# Patient Record
Sex: Male | Born: 1955
Health system: Southern US, Community
[De-identification: ages and names within clinical notes are randomized; demographics above are authoritative.]

## PROBLEM LIST (undated history)

## (undated) DIAGNOSIS — Z87898 Personal history of other specified conditions: Secondary | ICD-10-CM

## (undated) DIAGNOSIS — Z86010 Personal history of colonic polyps: Secondary | ICD-10-CM

## (undated) DIAGNOSIS — E785 Hyperlipidemia, unspecified: Secondary | ICD-10-CM

## (undated) DIAGNOSIS — R9431 Abnormal electrocardiogram [ECG] [EKG]: Secondary | ICD-10-CM

## (undated) DIAGNOSIS — C439 Malignant melanoma of skin, unspecified: Secondary | ICD-10-CM

## (undated) DIAGNOSIS — K219 Gastro-esophageal reflux disease without esophagitis: Secondary | ICD-10-CM

## (undated) DIAGNOSIS — N529 Male erectile dysfunction, unspecified: Secondary | ICD-10-CM

## (undated) DIAGNOSIS — I714 Abdominal aortic aneurysm, without rupture, unspecified: Secondary | ICD-10-CM

## (undated) DIAGNOSIS — Z860101 Personal history of adenomatous and serrated colon polyps: Secondary | ICD-10-CM

## (undated) DIAGNOSIS — E119 Type 2 diabetes mellitus without complications: Secondary | ICD-10-CM

## (undated) HISTORY — DX: Malignant melanoma of skin, unspecified: C43.9

## (undated) HISTORY — DX: Personal history of adenomatous and serrated colon polyps: Z86.0101

## (undated) HISTORY — DX: Abdominal aortic aneurysm, without rupture, unspecified: I71.40

## (undated) HISTORY — PX: OTHER SURGICAL HISTORY: SHX169

## (undated) HISTORY — DX: Personal history of other specified conditions: Z87.898

## (undated) HISTORY — DX: Male erectile dysfunction, unspecified: N52.9

## (undated) HISTORY — DX: Abnormal electrocardiogram (ECG) (EKG): R94.31

## (undated) HISTORY — DX: Hyperlipidemia, unspecified: E78.5

## (undated) HISTORY — DX: Gastro-esophageal reflux disease without esophagitis: K21.9

## (undated) HISTORY — DX: Personal history of colonic polyps: Z86.010

## (undated) HISTORY — PX: SHOULDER SURGERY: SHX246

---

## 2000-03-29 ENCOUNTER — Encounter: Payer: Self-pay | Admitting: Family Medicine

## 2000-03-29 ENCOUNTER — Ambulatory Visit (HOSPITAL_COMMUNITY): Admission: RE | Admit: 2000-03-29 | Discharge: 2000-03-29 | Payer: Self-pay | Admitting: Family Medicine

## 2015-06-25 ENCOUNTER — Other Ambulatory Visit: Payer: Self-pay | Admitting: Internal Medicine

## 2015-06-25 ENCOUNTER — Ambulatory Visit
Admission: RE | Admit: 2015-06-25 | Discharge: 2015-06-25 | Disposition: A | Discharge status: Home / Self Care | Payer: 59 | Source: Ambulatory Visit | Attending: Internal Medicine | Admitting: Internal Medicine

## 2015-06-25 DIAGNOSIS — R2241 Localized swelling, mass and lump, right lower limb: Secondary | ICD-10-CM

## 2015-06-25 DIAGNOSIS — M898X9 Other specified disorders of bone, unspecified site: Secondary | ICD-10-CM

## 2016-09-29 DIAGNOSIS — E1165 Type 2 diabetes mellitus with hyperglycemia: Secondary | ICD-10-CM | POA: Diagnosis not present

## 2016-09-29 DIAGNOSIS — Z23 Encounter for immunization: Secondary | ICD-10-CM | POA: Diagnosis not present

## 2016-09-29 DIAGNOSIS — Z0000 Encounter for general adult medical examination without abnormal findings: Secondary | ICD-10-CM | POA: Diagnosis not present

## 2016-09-29 DIAGNOSIS — M25512 Pain in left shoulder: Secondary | ICD-10-CM | POA: Diagnosis not present

## 2016-09-29 DIAGNOSIS — K219 Gastro-esophageal reflux disease without esophagitis: Secondary | ICD-10-CM | POA: Diagnosis not present

## 2016-09-29 DIAGNOSIS — E782 Mixed hyperlipidemia: Secondary | ICD-10-CM | POA: Diagnosis not present

## 2016-10-08 ENCOUNTER — Other Ambulatory Visit: Payer: Self-pay | Admitting: Family Medicine

## 2016-10-08 DIAGNOSIS — M25512 Pain in left shoulder: Secondary | ICD-10-CM

## 2016-10-08 DIAGNOSIS — Z1283 Encounter for screening for malignant neoplasm of skin: Secondary | ICD-10-CM | POA: Diagnosis not present

## 2016-10-08 DIAGNOSIS — D225 Melanocytic nevi of trunk: Secondary | ICD-10-CM | POA: Diagnosis not present

## 2016-10-08 DIAGNOSIS — D485 Neoplasm of uncertain behavior of skin: Secondary | ICD-10-CM | POA: Diagnosis not present

## 2016-10-14 DIAGNOSIS — M67912 Unspecified disorder of synovium and tendon, left shoulder: Secondary | ICD-10-CM | POA: Diagnosis not present

## 2016-10-15 DIAGNOSIS — M25512 Pain in left shoulder: Secondary | ICD-10-CM | POA: Diagnosis not present

## 2016-10-15 DIAGNOSIS — S46092D Other injury of muscle(s) and tendon(s) of the rotator cuff of left shoulder, subsequent encounter: Secondary | ICD-10-CM | POA: Diagnosis not present

## 2016-10-21 DIAGNOSIS — M25512 Pain in left shoulder: Secondary | ICD-10-CM | POA: Diagnosis not present

## 2016-10-21 DIAGNOSIS — S46092D Other injury of muscle(s) and tendon(s) of the rotator cuff of left shoulder, subsequent encounter: Secondary | ICD-10-CM | POA: Diagnosis not present

## 2016-11-03 DIAGNOSIS — M25512 Pain in left shoulder: Secondary | ICD-10-CM | POA: Diagnosis not present

## 2016-11-03 DIAGNOSIS — S46092D Other injury of muscle(s) and tendon(s) of the rotator cuff of left shoulder, subsequent encounter: Secondary | ICD-10-CM | POA: Diagnosis not present

## 2016-11-05 DIAGNOSIS — S46092D Other injury of muscle(s) and tendon(s) of the rotator cuff of left shoulder, subsequent encounter: Secondary | ICD-10-CM | POA: Diagnosis not present

## 2016-11-05 DIAGNOSIS — M25512 Pain in left shoulder: Secondary | ICD-10-CM | POA: Diagnosis not present

## 2016-11-11 DIAGNOSIS — M67912 Unspecified disorder of synovium and tendon, left shoulder: Secondary | ICD-10-CM | POA: Diagnosis not present

## 2016-11-11 DIAGNOSIS — S46092D Other injury of muscle(s) and tendon(s) of the rotator cuff of left shoulder, subsequent encounter: Secondary | ICD-10-CM | POA: Diagnosis not present

## 2016-11-11 DIAGNOSIS — M25512 Pain in left shoulder: Secondary | ICD-10-CM | POA: Diagnosis not present

## 2016-11-17 DIAGNOSIS — M25512 Pain in left shoulder: Secondary | ICD-10-CM | POA: Diagnosis not present

## 2016-11-17 DIAGNOSIS — S46092D Other injury of muscle(s) and tendon(s) of the rotator cuff of left shoulder, subsequent encounter: Secondary | ICD-10-CM | POA: Diagnosis not present

## 2016-11-20 DIAGNOSIS — M25512 Pain in left shoulder: Secondary | ICD-10-CM | POA: Diagnosis not present

## 2016-11-20 DIAGNOSIS — S46092D Other injury of muscle(s) and tendon(s) of the rotator cuff of left shoulder, subsequent encounter: Secondary | ICD-10-CM | POA: Diagnosis not present

## 2016-11-26 DIAGNOSIS — M25512 Pain in left shoulder: Secondary | ICD-10-CM | POA: Diagnosis not present

## 2016-11-26 DIAGNOSIS — S46092D Other injury of muscle(s) and tendon(s) of the rotator cuff of left shoulder, subsequent encounter: Secondary | ICD-10-CM | POA: Diagnosis not present

## 2016-12-01 DIAGNOSIS — S46092D Other injury of muscle(s) and tendon(s) of the rotator cuff of left shoulder, subsequent encounter: Secondary | ICD-10-CM | POA: Diagnosis not present

## 2016-12-01 DIAGNOSIS — M25512 Pain in left shoulder: Secondary | ICD-10-CM | POA: Diagnosis not present

## 2016-12-03 DIAGNOSIS — M25512 Pain in left shoulder: Secondary | ICD-10-CM | POA: Diagnosis not present

## 2016-12-03 DIAGNOSIS — S46092D Other injury of muscle(s) and tendon(s) of the rotator cuff of left shoulder, subsequent encounter: Secondary | ICD-10-CM | POA: Diagnosis not present

## 2016-12-08 DIAGNOSIS — M25512 Pain in left shoulder: Secondary | ICD-10-CM | POA: Diagnosis not present

## 2016-12-08 DIAGNOSIS — S46092D Other injury of muscle(s) and tendon(s) of the rotator cuff of left shoulder, subsequent encounter: Secondary | ICD-10-CM | POA: Diagnosis not present

## 2016-12-11 DIAGNOSIS — M25512 Pain in left shoulder: Secondary | ICD-10-CM | POA: Diagnosis not present

## 2016-12-11 DIAGNOSIS — S46092D Other injury of muscle(s) and tendon(s) of the rotator cuff of left shoulder, subsequent encounter: Secondary | ICD-10-CM | POA: Diagnosis not present

## 2016-12-16 DIAGNOSIS — M67912 Unspecified disorder of synovium and tendon, left shoulder: Secondary | ICD-10-CM | POA: Diagnosis not present

## 2017-01-21 DIAGNOSIS — E1165 Type 2 diabetes mellitus with hyperglycemia: Secondary | ICD-10-CM | POA: Diagnosis not present

## 2017-03-17 DIAGNOSIS — M7502 Adhesive capsulitis of left shoulder: Secondary | ICD-10-CM | POA: Diagnosis not present

## 2017-03-24 DIAGNOSIS — M25512 Pain in left shoulder: Secondary | ICD-10-CM | POA: Diagnosis not present

## 2017-03-26 DIAGNOSIS — M75112 Incomplete rotator cuff tear or rupture of left shoulder, not specified as traumatic: Secondary | ICD-10-CM | POA: Diagnosis not present

## 2017-04-13 DIAGNOSIS — K219 Gastro-esophageal reflux disease without esophagitis: Secondary | ICD-10-CM | POA: Diagnosis not present

## 2017-04-13 DIAGNOSIS — Z23 Encounter for immunization: Secondary | ICD-10-CM | POA: Diagnosis not present

## 2017-04-13 DIAGNOSIS — E782 Mixed hyperlipidemia: Secondary | ICD-10-CM | POA: Diagnosis not present

## 2017-05-20 DIAGNOSIS — E1165 Type 2 diabetes mellitus with hyperglycemia: Secondary | ICD-10-CM | POA: Diagnosis not present

## 2017-05-28 DIAGNOSIS — M75112 Incomplete rotator cuff tear or rupture of left shoulder, not specified as traumatic: Secondary | ICD-10-CM | POA: Diagnosis not present

## 2017-06-24 DIAGNOSIS — Z08 Encounter for follow-up examination after completed treatment for malignant neoplasm: Secondary | ICD-10-CM | POA: Diagnosis not present

## 2017-06-24 DIAGNOSIS — Z85820 Personal history of malignant melanoma of skin: Secondary | ICD-10-CM | POA: Diagnosis not present

## 2017-06-24 DIAGNOSIS — Z1283 Encounter for screening for malignant neoplasm of skin: Secondary | ICD-10-CM | POA: Diagnosis not present

## 2017-06-29 DIAGNOSIS — G8918 Other acute postprocedural pain: Secondary | ICD-10-CM | POA: Diagnosis not present

## 2017-06-29 DIAGNOSIS — M75112 Incomplete rotator cuff tear or rupture of left shoulder, not specified as traumatic: Secondary | ICD-10-CM | POA: Diagnosis not present

## 2017-06-29 DIAGNOSIS — M7542 Impingement syndrome of left shoulder: Secondary | ICD-10-CM | POA: Diagnosis not present

## 2017-06-29 DIAGNOSIS — M7502 Adhesive capsulitis of left shoulder: Secondary | ICD-10-CM | POA: Diagnosis not present

## 2017-06-30 DIAGNOSIS — M25512 Pain in left shoulder: Secondary | ICD-10-CM | POA: Diagnosis not present

## 2017-06-30 DIAGNOSIS — M25612 Stiffness of left shoulder, not elsewhere classified: Secondary | ICD-10-CM | POA: Diagnosis not present

## 2017-07-01 DIAGNOSIS — M25512 Pain in left shoulder: Secondary | ICD-10-CM | POA: Diagnosis not present

## 2017-07-01 DIAGNOSIS — M25612 Stiffness of left shoulder, not elsewhere classified: Secondary | ICD-10-CM | POA: Diagnosis not present

## 2017-07-07 DIAGNOSIS — M25512 Pain in left shoulder: Secondary | ICD-10-CM | POA: Diagnosis not present

## 2017-07-07 DIAGNOSIS — M25612 Stiffness of left shoulder, not elsewhere classified: Secondary | ICD-10-CM | POA: Diagnosis not present

## 2017-07-08 DIAGNOSIS — M25612 Stiffness of left shoulder, not elsewhere classified: Secondary | ICD-10-CM | POA: Diagnosis not present

## 2017-07-08 DIAGNOSIS — M25512 Pain in left shoulder: Secondary | ICD-10-CM | POA: Diagnosis not present

## 2017-07-09 DIAGNOSIS — M25612 Stiffness of left shoulder, not elsewhere classified: Secondary | ICD-10-CM | POA: Diagnosis not present

## 2017-07-09 DIAGNOSIS — M25512 Pain in left shoulder: Secondary | ICD-10-CM | POA: Diagnosis not present

## 2017-07-12 DIAGNOSIS — M25512 Pain in left shoulder: Secondary | ICD-10-CM | POA: Diagnosis not present

## 2017-07-12 DIAGNOSIS — M25612 Stiffness of left shoulder, not elsewhere classified: Secondary | ICD-10-CM | POA: Diagnosis not present

## 2017-07-14 DIAGNOSIS — M25612 Stiffness of left shoulder, not elsewhere classified: Secondary | ICD-10-CM | POA: Diagnosis not present

## 2017-07-14 DIAGNOSIS — M25512 Pain in left shoulder: Secondary | ICD-10-CM | POA: Diagnosis not present

## 2017-07-16 DIAGNOSIS — M25612 Stiffness of left shoulder, not elsewhere classified: Secondary | ICD-10-CM | POA: Diagnosis not present

## 2017-07-16 DIAGNOSIS — M25512 Pain in left shoulder: Secondary | ICD-10-CM | POA: Diagnosis not present

## 2017-07-19 DIAGNOSIS — M25612 Stiffness of left shoulder, not elsewhere classified: Secondary | ICD-10-CM | POA: Diagnosis not present

## 2017-07-19 DIAGNOSIS — M25512 Pain in left shoulder: Secondary | ICD-10-CM | POA: Diagnosis not present

## 2017-07-21 DIAGNOSIS — M25612 Stiffness of left shoulder, not elsewhere classified: Secondary | ICD-10-CM | POA: Diagnosis not present

## 2017-07-21 DIAGNOSIS — M25512 Pain in left shoulder: Secondary | ICD-10-CM | POA: Diagnosis not present

## 2017-07-22 DIAGNOSIS — M25512 Pain in left shoulder: Secondary | ICD-10-CM | POA: Diagnosis not present

## 2017-07-22 DIAGNOSIS — M25612 Stiffness of left shoulder, not elsewhere classified: Secondary | ICD-10-CM | POA: Diagnosis not present

## 2017-07-26 DIAGNOSIS — M25612 Stiffness of left shoulder, not elsewhere classified: Secondary | ICD-10-CM | POA: Diagnosis not present

## 2017-07-26 DIAGNOSIS — M25512 Pain in left shoulder: Secondary | ICD-10-CM | POA: Diagnosis not present

## 2017-07-28 DIAGNOSIS — M25512 Pain in left shoulder: Secondary | ICD-10-CM | POA: Diagnosis not present

## 2017-07-28 DIAGNOSIS — M25612 Stiffness of left shoulder, not elsewhere classified: Secondary | ICD-10-CM | POA: Diagnosis not present

## 2017-08-02 DIAGNOSIS — M25612 Stiffness of left shoulder, not elsewhere classified: Secondary | ICD-10-CM | POA: Diagnosis not present

## 2017-08-02 DIAGNOSIS — M25512 Pain in left shoulder: Secondary | ICD-10-CM | POA: Diagnosis not present

## 2017-08-04 DIAGNOSIS — M25612 Stiffness of left shoulder, not elsewhere classified: Secondary | ICD-10-CM | POA: Diagnosis not present

## 2017-08-04 DIAGNOSIS — M25512 Pain in left shoulder: Secondary | ICD-10-CM | POA: Diagnosis not present

## 2017-08-06 DIAGNOSIS — M25512 Pain in left shoulder: Secondary | ICD-10-CM | POA: Diagnosis not present

## 2017-08-06 DIAGNOSIS — M25612 Stiffness of left shoulder, not elsewhere classified: Secondary | ICD-10-CM | POA: Diagnosis not present

## 2017-08-16 DIAGNOSIS — M25612 Stiffness of left shoulder, not elsewhere classified: Secondary | ICD-10-CM | POA: Diagnosis not present

## 2017-08-16 DIAGNOSIS — M25512 Pain in left shoulder: Secondary | ICD-10-CM | POA: Diagnosis not present

## 2017-08-18 DIAGNOSIS — M25512 Pain in left shoulder: Secondary | ICD-10-CM | POA: Diagnosis not present

## 2017-08-18 DIAGNOSIS — M25612 Stiffness of left shoulder, not elsewhere classified: Secondary | ICD-10-CM | POA: Diagnosis not present

## 2017-08-19 DIAGNOSIS — E1165 Type 2 diabetes mellitus with hyperglycemia: Secondary | ICD-10-CM | POA: Diagnosis not present

## 2017-08-30 DIAGNOSIS — M25512 Pain in left shoulder: Secondary | ICD-10-CM | POA: Diagnosis not present

## 2017-08-30 DIAGNOSIS — M25612 Stiffness of left shoulder, not elsewhere classified: Secondary | ICD-10-CM | POA: Diagnosis not present

## 2017-11-26 DIAGNOSIS — K219 Gastro-esophageal reflux disease without esophagitis: Secondary | ICD-10-CM | POA: Diagnosis not present

## 2017-11-26 DIAGNOSIS — Z23 Encounter for immunization: Secondary | ICD-10-CM | POA: Diagnosis not present

## 2017-11-26 DIAGNOSIS — Z125 Encounter for screening for malignant neoplasm of prostate: Secondary | ICD-10-CM | POA: Diagnosis not present

## 2017-11-26 DIAGNOSIS — E782 Mixed hyperlipidemia: Secondary | ICD-10-CM | POA: Diagnosis not present

## 2017-11-26 DIAGNOSIS — Z0000 Encounter for general adult medical examination without abnormal findings: Secondary | ICD-10-CM | POA: Diagnosis not present

## 2017-12-28 DIAGNOSIS — R972 Elevated prostate specific antigen [PSA]: Secondary | ICD-10-CM | POA: Diagnosis not present

## 2018-01-05 DIAGNOSIS — E119 Type 2 diabetes mellitus without complications: Secondary | ICD-10-CM | POA: Diagnosis not present

## 2018-02-01 DIAGNOSIS — N400 Enlarged prostate without lower urinary tract symptoms: Secondary | ICD-10-CM | POA: Diagnosis not present

## 2018-02-01 DIAGNOSIS — R972 Elevated prostate specific antigen [PSA]: Secondary | ICD-10-CM | POA: Diagnosis not present

## 2018-02-17 DIAGNOSIS — R972 Elevated prostate specific antigen [PSA]: Secondary | ICD-10-CM | POA: Diagnosis not present

## 2018-02-22 DIAGNOSIS — Z23 Encounter for immunization: Secondary | ICD-10-CM | POA: Diagnosis not present

## 2018-03-01 DIAGNOSIS — R972 Elevated prostate specific antigen [PSA]: Secondary | ICD-10-CM | POA: Diagnosis not present

## 2018-03-03 DIAGNOSIS — E1165 Type 2 diabetes mellitus with hyperglycemia: Secondary | ICD-10-CM | POA: Diagnosis not present

## 2018-03-03 DIAGNOSIS — N529 Male erectile dysfunction, unspecified: Secondary | ICD-10-CM | POA: Diagnosis not present

## 2018-03-03 DIAGNOSIS — Z5181 Encounter for therapeutic drug level monitoring: Secondary | ICD-10-CM | POA: Diagnosis not present

## 2018-05-30 DIAGNOSIS — B078 Other viral warts: Secondary | ICD-10-CM | POA: Diagnosis not present

## 2018-05-30 DIAGNOSIS — B079 Viral wart, unspecified: Secondary | ICD-10-CM | POA: Diagnosis not present

## 2018-05-30 DIAGNOSIS — D229 Melanocytic nevi, unspecified: Secondary | ICD-10-CM | POA: Diagnosis not present

## 2018-05-30 DIAGNOSIS — Z1283 Encounter for screening for malignant neoplasm of skin: Secondary | ICD-10-CM | POA: Diagnosis not present

## 2018-05-30 DIAGNOSIS — D225 Melanocytic nevi of trunk: Secondary | ICD-10-CM | POA: Diagnosis not present

## 2018-05-30 DIAGNOSIS — D485 Neoplasm of uncertain behavior of skin: Secondary | ICD-10-CM | POA: Diagnosis not present

## 2018-05-30 DIAGNOSIS — Z85820 Personal history of malignant melanoma of skin: Secondary | ICD-10-CM | POA: Diagnosis not present

## 2018-06-07 DIAGNOSIS — K219 Gastro-esophageal reflux disease without esophagitis: Secondary | ICD-10-CM | POA: Diagnosis not present

## 2018-06-07 DIAGNOSIS — E782 Mixed hyperlipidemia: Secondary | ICD-10-CM | POA: Diagnosis not present

## 2018-06-07 DIAGNOSIS — N529 Male erectile dysfunction, unspecified: Secondary | ICD-10-CM | POA: Diagnosis not present

## 2018-06-14 DIAGNOSIS — N529 Male erectile dysfunction, unspecified: Secondary | ICD-10-CM | POA: Diagnosis not present

## 2018-06-14 DIAGNOSIS — E1165 Type 2 diabetes mellitus with hyperglycemia: Secondary | ICD-10-CM | POA: Diagnosis not present

## 2018-06-14 DIAGNOSIS — Z79899 Other long term (current) drug therapy: Secondary | ICD-10-CM | POA: Diagnosis not present

## 2018-08-29 DIAGNOSIS — R972 Elevated prostate specific antigen [PSA]: Secondary | ICD-10-CM | POA: Diagnosis not present

## 2018-09-15 DIAGNOSIS — Z5181 Encounter for therapeutic drug level monitoring: Secondary | ICD-10-CM | POA: Diagnosis not present

## 2018-09-15 DIAGNOSIS — E1165 Type 2 diabetes mellitus with hyperglycemia: Secondary | ICD-10-CM | POA: Diagnosis not present

## 2018-09-15 DIAGNOSIS — N529 Male erectile dysfunction, unspecified: Secondary | ICD-10-CM | POA: Diagnosis not present

## 2019-09-08 ENCOUNTER — Other Ambulatory Visit: Payer: Self-pay | Admitting: Urology

## 2019-09-08 DIAGNOSIS — R972 Elevated prostate specific antigen [PSA]: Secondary | ICD-10-CM

## 2019-10-07 ENCOUNTER — Ambulatory Visit
Admission: RE | Admit: 2019-10-07 | Discharge: 2019-10-07 | Disposition: A | Discharge status: Home / Self Care | Payer: 59 | Source: Ambulatory Visit | Attending: Urology | Admitting: Urology

## 2019-10-07 DIAGNOSIS — R972 Elevated prostate specific antigen [PSA]: Secondary | ICD-10-CM

## 2019-10-07 IMAGING — MR MR PROSTATE WO/W CM
56 series · 56 of 56 positions shown · IV contrast (14ml multihance)
Comparison: None.

CLINICAL DATA: Elevated PSA (9.7).  Benign biopsy 2 years ago.

EXAM:
MR PROSTATE WITHOUT AND WITH CONTRAST
TECHNIQUE: Multiplanar multisequence MRI images were obtained of the pelvis
centered about the prostate. Pre and post contrast images were
obtained.
CONTRAST:  14mL MULTIHANCE GADOBENATE DIMEGLUMINE 529 MG/ML IV SOLN

[Series 2: bSSFP fat-sat · axial · 8.0mm · 0.74mm/px · 1 of 28 slices shown]
[im 1/28]
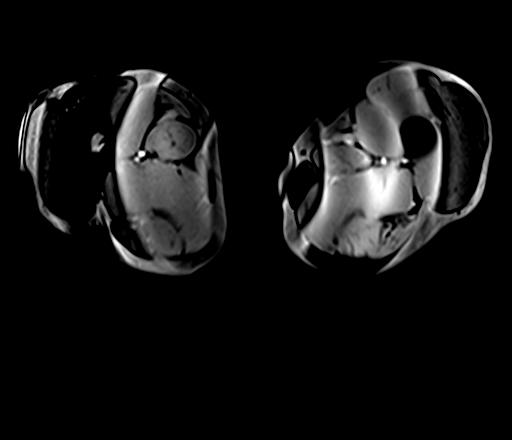

[Series 3: T1 · axial · 5.0mm · 1.25mm/px · 1 of 80 slices shown]
[im 1/80]
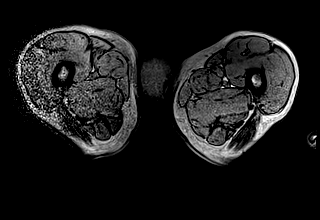

[Series 4: T2 · coronal · 3.5mm · 0.56mm/px · 1 of 27 slices shown (1 of 3)]
[im 1/27]
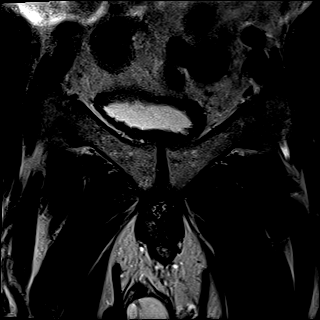

[Series 5: DWI · axial · 3.5mm · 1.75mm/px · 1 of 83 slices shown (1 of 3)]
[im 1/83]
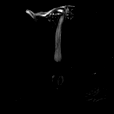

[Series 6: DWI · axial · 3.5mm · 1.75mm/px · 1 of 28 slices shown (2 of 3)]
[im 1/28]
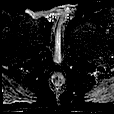

[Series 7: DWI · axial · 3.5mm · 1.56mm/px · 1 of 23 slices shown (3 of 3)]
[im 1/23]
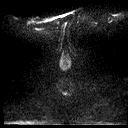

[Series 8: T2 · axial · 3.5mm · 0.56mm/px · 1 of 23 slices shown (2 of 3)]
[im 1/23]
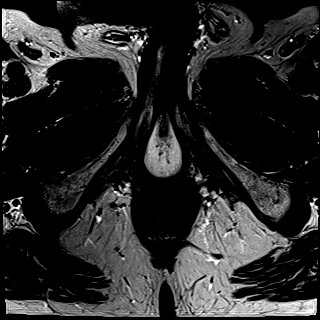

[Series 9: T2 · axial · 1.0mm · 1.04mm/px · 1 of 80 slices shown (3 of 3)]
[im 1/80]
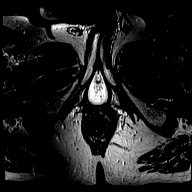

[Series 10: pre t1_twist_tra_dyn_ttc=5.8s · axial · non-contrast · 3.5mm · 0.83mm/px · 1 of 24 slices shown]
[im 1/24]
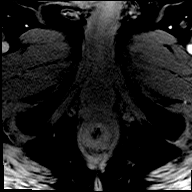

[Series 11: post t1_twist_tra_dyn-copy center · axial · 3.5mm · 0.83mm/px · 1 of 24 slices shown (1 of 24)]
[im 1/24]
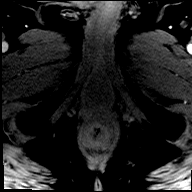

[Series 12: post t1_twist_tra_dyn-copy center · axial · 3.5mm · 0.83mm/px · 1 of 24 slices shown (2 of 24)]
[im 1/24]
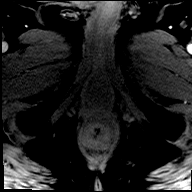

[Series 13: post t1_twist_tra_dyn-copy cent_sub_ttc=(id) · axial · 3.5mm · 0.83mm/px · 1 of 22 slices shown (1 of 23)]
[im 1/22]
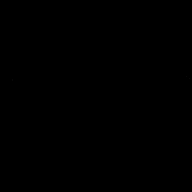

[Series 14: post t1_twist_tra_dyn-copy center · axial · 3.5mm · 0.83mm/px · 1 of 24 slices shown (3 of 24)]
[im 1/24]
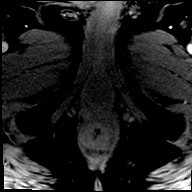

[Series 15: post t1_twist_tra_dyn-copy cent_sub_ttc=(id) · axial · 3.5mm · 0.83mm/px · 1 of 24 slices shown (2 of 23)]
[im 1/24]
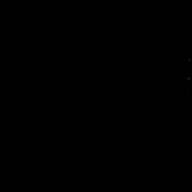

[Series 16: post t1_twist_tra_dyn-copy center · axial · 3.5mm · 0.83mm/px · 1 of 24 slices shown (4 of 24)]
[im 1/24]
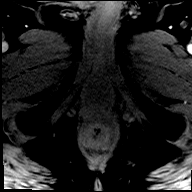

[Series 17: post t1_twist_tra_dyn-copy cent_sub_ttc=(id) · axial · 3.5mm · 0.83mm/px · 1 of 23 slices shown (3 of 23)]
[im 1/23]
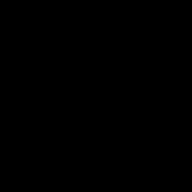

[Series 18: post t1_twist_tra_dyn-copy center · axial · 3.5mm · 0.83mm/px · 1 of 24 slices shown (5 of 24)]
[im 1/24]
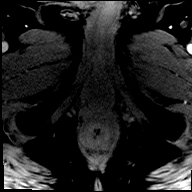

[Series 19: post t1_twist_tra_dyn-copy cent_sub_ttc=(id) · axial · 3.5mm · 0.83mm/px · 1 of 24 slices shown (4 of 23)]
[im 1/24]
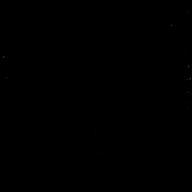

[Series 20: post t1_twist_tra_dyn-copy center · axial · 3.5mm · 0.83mm/px · 1 of 24 slices shown (6 of 24)]
[im 1/24]
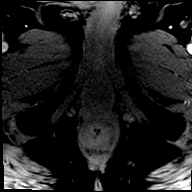

[Series 21: post t1_twist_tra_dyn-copy cent_sub_ttc=(id) · axial · 3.5mm · 0.83mm/px · 1 of 24 slices shown (5 of 23)]
[im 1/24]
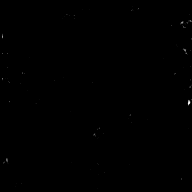

[Series 22: post t1_twist_tra_dyn-copy center · axial · 3.5mm · 0.83mm/px · 1 of 24 slices shown (7 of 24)]
[im 1/24]
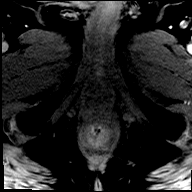

[Series 23: post t1_twist_tra_dyn-copy cent_sub_ttc=(id) · axial · 3.5mm · 0.83mm/px · 1 of 24 slices shown (6 of 23)]
[im 1/24]
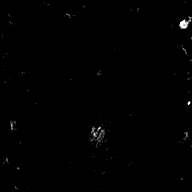

[Series 24: post t1_twist_tra_dyn-copy center · axial · 3.5mm · 0.83mm/px · 1 of 24 slices shown (8 of 24)]
[im 1/24]
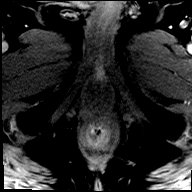

[Series 25: post t1_twist_tra_dyn-copy cent_sub_ttc=(id) · axial · 3.5mm · 0.83mm/px · 1 of 24 slices shown (7 of 23)]
[im 1/24]
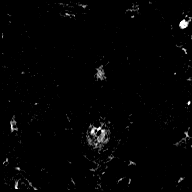

[Series 26: post t1_twist_tra_dyn-copy center · axial · 3.5mm · 0.83mm/px · 1 of 24 slices shown (9 of 24)]
[im 1/24]
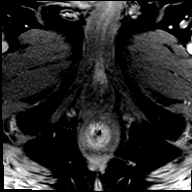

[Series 27: post t1_twist_tra_dyn-copy cent_sub_ttc=(id) · axial · 3.5mm · 0.83mm/px · 1 of 24 slices shown (8 of 23)]
[im 1/24]
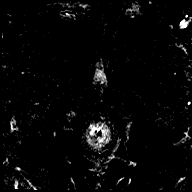

[Series 28: post t1_twist_tra_dyn-copy center · axial · 3.5mm · 0.83mm/px · 1 of 24 slices shown (10 of 24)]
[im 1/24]
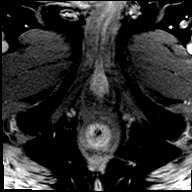

[Series 29: post t1_twist_tra_dyn-copy cent_sub_ttc=(id) · axial · 3.5mm · 0.83mm/px · 1 of 24 slices shown (9 of 23)]
[im 1/24]
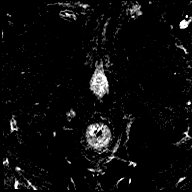

[Series 30: post t1_twist_tra_dyn-copy center · axial · 3.5mm · 0.83mm/px · 1 of 24 slices shown (11 of 24)]
[im 1/24]
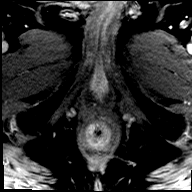

[Series 31: post t1_twist_tra_dyn-copy cent_sub_ttc=(id) · axial · 3.5mm · 0.83mm/px · 1 of 24 slices shown (10 of 23)]
[im 1/24]
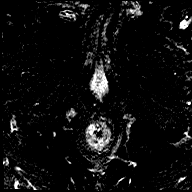

[Series 32: post t1_twist_tra_dyn-copy center · axial · 3.5mm · 0.83mm/px · 1 of 24 slices shown (12 of 24)]
[im 1/24]
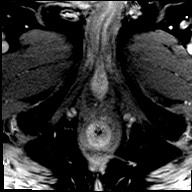

[Series 33: post t1_twist_tra_dyn-copy cent_sub_ttc=(id) · axial · 3.5mm · 0.83mm/px · 1 of 24 slices shown (11 of 23)]
[im 1/24]
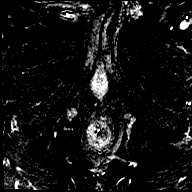

[Series 34: post t1_twist_tra_dyn-copy center · axial · 3.5mm · 0.83mm/px · 1 of 24 slices shown (13 of 24)]
[im 1/24]
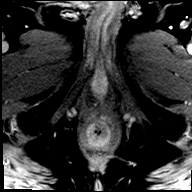

[Series 35: post t1_twist_tra_dyn-copy cent_sub_ttc=(id) · axial · 3.5mm · 0.83mm/px · 1 of 24 slices shown (12 of 23)]
[im 1/24]
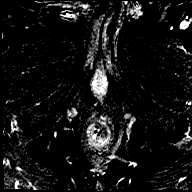

[Series 36: post t1_twist_tra_dyn-copy center · axial · 3.5mm · 0.83mm/px · 1 of 24 slices shown (14 of 24)]
[im 1/24]
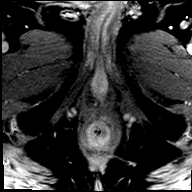

[Series 37: post t1_twist_tra_dyn-copy cent_sub_ttc=(id) · axial · 3.5mm · 0.83mm/px · 1 of 24 slices shown (13 of 23)]
[im 1/24]
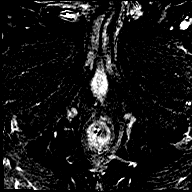

[Series 38: post t1_twist_tra_dyn-copy center · axial · 3.5mm · 0.83mm/px · 1 of 24 slices shown (15 of 24)]
[im 1/24]
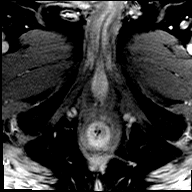

[Series 39: post t1_twist_tra_dyn-copy cent_sub_ttc=(id) · axial · 3.5mm · 0.83mm/px · 1 of 24 slices shown (14 of 23)]
[im 1/24]
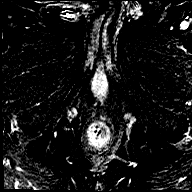

[Series 40: post t1_twist_tra_dyn-copy center · axial · 3.5mm · 0.83mm/px · 1 of 24 slices shown (16 of 24)]
[im 1/24]
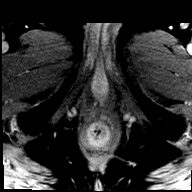

[Series 41: post t1_twist_tra_dyn-copy cent_sub_ttc=(id) · axial · 3.5mm · 0.83mm/px · 1 of 24 slices shown (15 of 23)]
[im 1/24]
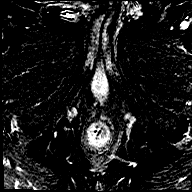

[Series 42: post t1_twist_tra_dyn-copy center · axial · 3.5mm · 0.83mm/px · 1 of 24 slices shown (17 of 24)]
[im 1/24]
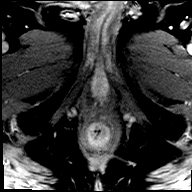

[Series 43: post t1_twist_tra_dyn-copy cent_sub_ttc=(id) · axial · 3.5mm · 0.83mm/px · 1 of 24 slices shown (16 of 23)]
[im 1/24]
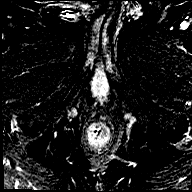

[Series 44: post t1_twist_tra_dyn-copy center · axial · 3.5mm · 0.83mm/px · 1 of 24 slices shown (18 of 24)]
[im 1/24]
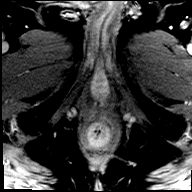

[Series 45: post t1_twist_tra_dyn-copy cent_sub_ttc=(id) · axial · 3.5mm · 0.83mm/px · 1 of 24 slices shown (17 of 23)]
[im 1/24]
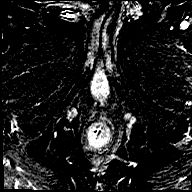

[Series 46: post t1_twist_tra_dyn-copy center · axial · 3.5mm · 0.83mm/px · 1 of 24 slices shown (19 of 24)]
[im 1/24]
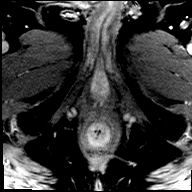

[Series 47: post t1_twist_tra_dyn-copy cent_sub_ttc=(id) · axial · 3.5mm · 0.83mm/px · 1 of 24 slices shown (18 of 23)]
[im 1/24]
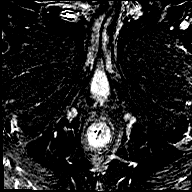

[Series 48: post t1_twist_tra_dyn-copy center · axial · 3.5mm · 0.83mm/px · 1 of 24 slices shown (20 of 24)]
[im 1/24]
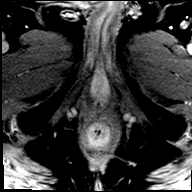

[Series 49: post t1_twist_tra_dyn-copy cent_sub_ttc=(id) · axial · 3.5mm · 0.83mm/px · 1 of 24 slices shown (19 of 23)]
[im 1/24]
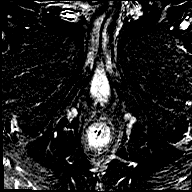

[Series 50: post t1_twist_tra_dyn-copy center · axial · 3.5mm · 0.83mm/px · 1 of 24 slices shown (21 of 24)]
[im 1/24]
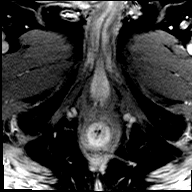

[Series 51: post t1_twist_tra_dyn-copy cent_sub_ttc=(id) · axial · 3.5mm · 0.83mm/px · 1 of 24 slices shown (20 of 23)]
[im 1/24]
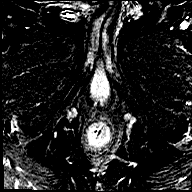

[Series 52: post t1_twist_tra_dyn-copy center · axial · 3.5mm · 0.83mm/px · 1 of 24 slices shown (22 of 24)]
[im 1/24]
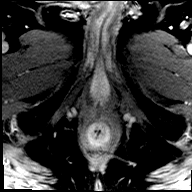

[Series 53: post t1_twist_tra_dyn-copy cent_sub_ttc=(id) · axial · 3.5mm · 0.83mm/px · 1 of 24 slices shown (21 of 23)]
[im 1/24]
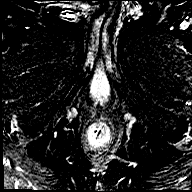

[Series 54: post t1_twist_tra_dyn-copy center · axial · 3.5mm · 0.83mm/px · 1 of 24 slices shown (23 of 24)]
[im 1/24]
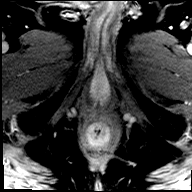

[Series 55: post t1_twist_tra_dyn-copy cent_sub_ttc=(id) · axial · 3.5mm · 0.83mm/px · 1 of 24 slices shown (22 of 23)]
[im 1/24]
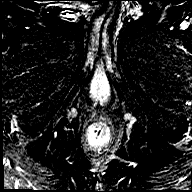

[Series 56: post t1_twist_tra_dyn-copy center · axial · 3.5mm · 0.83mm/px · 1 of 24 slices shown (24 of 24)]
[im 1/24]
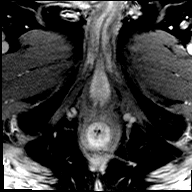

[Series 57: post t1_twist_tra_dyn-copy cent_sub_ttc=(id) · axial · 3.5mm · 0.83mm/px · 1 of 24 slices shown (23 of 23)]
[im 1/24]
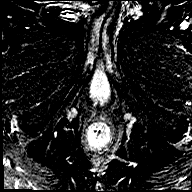

[56 of 56 positions shown; findings below may reference images not displayed]

FINDINGS: Prostate: 9 x 6 x 10 mm focal low T2 lesion at the extreme right
apex of the peripheral zone (series 9/image 63), mildly
linear/wedge-shaped, but with associated early arterial enhancement
(series 22/image 18). No restricted diffusion/low ADC. PI-RADS 3.

Marked enlargement/nodularity of the central gland, indenting the
base of the bladder, suggesting BPH. Some of the BPH nodules
demonstrate low ADC, but no true restricted diffusion. Additionally,
these are well demarcated nodules, without suspicious appearance on
T2.

Volume: 3.9 x 5.0 x 5.2 cm (volume = 53 mL)

Transcapsular spread:  Absent.

Seminal vesicle involvement: Absent.

Neurovascular bundle involvement: Absent.

Pelvic adenopathy: Absent.

Bone metastasis: Absent.

Other findings: None.
IMPRESSION: 10 mm lesion at the extreme right apex of the peripheral zone,
raising the possibility of high-grade macroscopic prostate cancer.
PI-RADS 3. This lesion was marked in DynaCAD 3D software for
potential UroNAV biopsy.

No evidence of extracapsular extension, seminal vesicle invasion,
lymphadenopathy, or metastatic disease.

Suspected BPH.  Calculated prostate volume 53 mL.

## 2019-10-07 MED ORDER — GADOBENATE DIMEGLUMINE 529 MG/ML IV SOLN
14.0000 mL | Freq: Once | INTRAVENOUS | Status: AC | PRN
  Administered 2019-10-07: 14 mL via INTRAVENOUS

## 2020-08-31 ENCOUNTER — Other Ambulatory Visit: Payer: Self-pay

## 2020-08-31 ENCOUNTER — Encounter (HOSPITAL_BASED_OUTPATIENT_CLINIC_OR_DEPARTMENT_OTHER): Payer: Self-pay | Admitting: *Deleted

## 2020-08-31 ENCOUNTER — Emergency Department (HOSPITAL_BASED_OUTPATIENT_CLINIC_OR_DEPARTMENT_OTHER)
Admission: EM | Admit: 2020-08-31 | Discharge: 2020-08-31 | Disposition: A | Discharge status: Home / Self Care | Payer: Managed Care, Other (non HMO) | Attending: Emergency Medicine | Admitting: Emergency Medicine

## 2020-08-31 DIAGNOSIS — H538 Other visual disturbances: Secondary | ICD-10-CM | POA: Insufficient documentation

## 2020-08-31 DIAGNOSIS — E119 Type 2 diabetes mellitus without complications: Secondary | ICD-10-CM | POA: Insufficient documentation

## 2020-08-31 DIAGNOSIS — H539 Unspecified visual disturbance: Secondary | ICD-10-CM

## 2020-08-31 DIAGNOSIS — H5789 Other specified disorders of eye and adnexa: Secondary | ICD-10-CM | POA: Diagnosis present

## 2020-08-31 HISTORY — DX: Type 2 diabetes mellitus without complications: E11.9

## 2020-08-31 NOTE — ED Triage Notes (Signed)
Pt working in yard, noticed rt eye has a "hemorrhage floating" around in his vision. No pain noted. Reports no visual changes just sees it floating in eye.

## 2020-08-31 NOTE — ED Notes (Signed)
Visual acuity: both eyes 20/30                       Left eye 20/25                       Right eye 20/70

## 2020-08-31 NOTE — ED Provider Notes (Signed)
[facility] EMERGENCY DEPT Provider Note   CSN: [id number] Arrival date & time: 08/31/20  1129     History No chief complaint on file.   Brett Wilkerson is a 65 y.o. male.  Patient presents with a sensation of seeing a squiggly line from his right eye.  He noticed this yesterday.  He states that it looks like a blood vessel that is colorless.  Flashes of lights.  Denies any spots or curtainlike vision loss.  He states that when he looks at a certain corner he can see a squiggly line that reminds him of a blood vessel.  He otherwise denies eye pain no vision change per patient.  He has baseline decreased vision in the right eye which he states is about the same as always today.        Past Medical History:  Diagnosis Date  . Diabetes mellitus without complication ([place])     There are no problems to display for this patient.   Past Surgical History:  Procedure Laterality Date  . SHOULDER SURGERY         No family history on file.  Social History   Tobacco Use  . Smoking status: Never Smoker  . Smokeless tobacco: Never Used  Substance Use Topics  . Alcohol use: Never  . Drug use: Never    Home Medications Prior to Admission medications   Not on File    Allergies    Patient has no known allergies.  Review of Systems   Review of Systems  Constitutional: Negative for fever.  HENT: Negative for ear pain and sore throat.   Eyes: Negative for pain.  Respiratory: Negative for cough.   Cardiovascular: Negative for chest pain.  Gastrointestinal: Negative for abdominal pain.  Genitourinary: Negative for flank pain.  Musculoskeletal: Negative for back pain.  Skin: Negative for color change and rash.  Neurological: Negative for syncope.  All other systems reviewed and are negative.   Physical Exam Updated Vital Signs BP (!) 145/83   Pulse 65   Temp 98.1 F (36.7 C) (Oral)   Resp 20   Ht 5\' 7"  (1.702 m)   Wt 69.4 kg   SpO2 98%   BMI  23.96 kg/m   Physical Exam Constitutional:      General: He is not in acute distress.    Appearance: He is well-developed.  HENT:     Head: Normocephalic.     Nose: Nose normal.  Eyes:     General: No scleral icterus.       Right eye: No discharge.        Left eye: No discharge.     Extraocular Movements: Extraocular movements intact.     Conjunctiva/sclera: Conjunctivae normal.     Pupils: Pupils are equal, round, and reactive to light.     Comments: No pain with extraocular motion.  No evidence of conjunctival injection or erythema.  Cardiovascular:     Rate and Rhythm: Normal rate.  Pulmonary:     Effort: Pulmonary effort is normal.  Skin:    Coloration: Skin is not jaundiced.  Neurological:     Mental Status: He is alert. Mental status is at baseline.     ED Results / Procedures / Treatments   Labs (all labs ordered are listed, but only abnormal results are displayed) Labs Reviewed - No data to display  EKG None  Radiology No results found.  Procedures Procedures   Medications Ordered in ED Medications - No data  to display  ED Course  I have reviewed the triage vital signs and the nursing notes.  Pertinent labs & imaging results that were available during my care of the patient were reviewed by me and considered in my medical decision making (see chart for details).    MDM Rules/Calculators/A&P                          Patient has no pain or discomfort.  He denies any vision change.  He states he does have an ophthalmologist who he has not called yet since it is Saturday today.  Visual acuity noted to be 20/70 in the right eye and 20/25 in the left eye.  I advised him to call his ophthalmologist Monday morning.  He is unable to reach them or cannot make an appointment within the next 2 or 3 days patient to return back to the ER, especially if he has any worsening vision changes or additional concerns.  Final Clinical Impression(s) / ED Diagnoses Final  diagnoses:  Visual disturbances    Rx / DC Orders ED Discharge Orders    None       Luna Fuse, MD 08/31/20 1206

## 2020-08-31 NOTE — Discharge Instructions (Addendum)
Call your primary care doctor or specialist as discussed in the next 2-3 days.   Return immediately back to the ER if:  Your symptoms worsen within the next 12-24 hours. You develop new symptoms such as new fevers, persistent vomiting, new pain, shortness of breath, or new weakness or numbness, or if you have any other concerns.  

## 2021-10-09 DIAGNOSIS — E782 Mixed hyperlipidemia: Secondary | ICD-10-CM | POA: Diagnosis not present

## 2021-10-09 DIAGNOSIS — N529 Male erectile dysfunction, unspecified: Secondary | ICD-10-CM | POA: Diagnosis not present

## 2021-10-09 DIAGNOSIS — Z7985 Long-term (current) use of injectable non-insulin antidiabetic drugs: Secondary | ICD-10-CM | POA: Diagnosis not present

## 2021-10-09 DIAGNOSIS — Z7984 Long term (current) use of oral hypoglycemic drugs: Secondary | ICD-10-CM | POA: Diagnosis not present

## 2021-10-09 DIAGNOSIS — E1169 Type 2 diabetes mellitus with other specified complication: Secondary | ICD-10-CM | POA: Diagnosis not present

## 2021-10-09 DIAGNOSIS — Z1331 Encounter for screening for depression: Secondary | ICD-10-CM | POA: Diagnosis not present

## 2021-10-09 DIAGNOSIS — K219 Gastro-esophageal reflux disease without esophagitis: Secondary | ICD-10-CM | POA: Diagnosis not present

## 2021-10-09 DIAGNOSIS — Z23 Encounter for immunization: Secondary | ICD-10-CM | POA: Diagnosis not present

## 2021-10-09 DIAGNOSIS — R9431 Abnormal electrocardiogram [ECG] [EKG]: Secondary | ICD-10-CM | POA: Diagnosis not present

## 2021-10-09 DIAGNOSIS — Z0000 Encounter for general adult medical examination without abnormal findings: Secondary | ICD-10-CM | POA: Diagnosis not present

## 2021-10-09 DIAGNOSIS — Z85820 Personal history of malignant melanoma of skin: Secondary | ICD-10-CM | POA: Diagnosis not present

## 2021-10-09 DIAGNOSIS — R972 Elevated prostate specific antigen [PSA]: Secondary | ICD-10-CM | POA: Diagnosis not present

## 2021-10-10 ENCOUNTER — Telehealth: Payer: Self-pay

## 2021-10-10 NOTE — Telephone Encounter (Signed)
Notes scanned to referral 

## 2021-10-15 NOTE — Progress Notes (Unsigned)
Cardiology Office Note:    Date:  10/15/2021   ID:  Brett Wilkerson, DOB 1955/10/21, MRN [id number]  PCP:  Antony Contras, MD   [facility] Providers Cardiologist:  None {  Referring MD: Antony Contras, MD    History of Present Illness:    Brett Wilkerson is a 66 y.o. male with a hx of DMII, AAA, GERD, and HLD who was referred by Dr. Moreen Fowler for further evaluation of an abnormal ECG.   Patient was seen by Dr. Moreen Fowler for a follow-up visit on 10/10/21 where he was incidentally noted to have q-waves in the inferior leads on ECG. Given risk factors and findings, he is now referred to Cardiology for further evaluation.  Today, ***  Past Medical History:  Diagnosis Date   AAA (abdominal aortic aneurysm) ([facility])    Diabetes mellitus without complication ([place])    ED (erectile dysfunction)    EKG, abnormal    GERD (gastroesophageal reflux disease)    History of adenomatous polyp of colon    History of elevated PSA    Hyperlipidemia    Melanoma ([place])     Past Surgical History:  Procedure Laterality Date   LEFT SHOULDER REPAIR     SHOULDER SURGERY      Current Medications: No outpatient medications have been marked as taking for the 10/21/21 encounter (Appointment) with Freada Bergeron, MD.     Allergies:   Patient has no known allergies.   Social History   Socioeconomic History   Marital status: Married    Spouse name: Not on file   Number of children: Not on file   Years of education: Not on file   Highest education level: Not on file  Occupational History   Not on file  Tobacco Use   Smoking status: Never   Smokeless tobacco: Never  Substance and Sexual Activity   Alcohol use: Never   Drug use: Never   Sexual activity: Not on file  Other Topics Concern   Not on file  Social History Narrative   Not on file   Social Determinants of Health   Financial Resource Strain: Not on file  Food Insecurity: Not on file  Transportation Needs: Not on file   Physical Activity: Not on file  Stress: Not on file  Social Connections: Not on file     Family History: The patient's ***family history includes Diabetes in his father; Heart attack in his mother; Hypercholesterolemia in his father; Hypertension in his father.  ROS:   Please see the history of present illness.    *** All other systems reviewed and are negative.  EKGs/Labs/Other Studies Reviewed:    The following studies were reviewed today: ***  EKG:  EKG is *** ordered today.  The ekg ordered today demonstrates ***  Recent Labs: No results found for requested labs within last 8760 hours.  Recent Lipid Panel No results found for: CHOL, TRIG, HDL, CHOLHDL, VLDL, LDLCALC, LDLDIRECT   Risk Assessment/Calculations:   {Does this patient have ATRIAL FIBRILLATION?[phone]}       Physical Exam:    VS:  There were no vitals taken for this visit.    Wt Readings from Last 3 Encounters:  08/31/20 153 lb (69.4 kg)     GEN: *** Well nourished, well developed in no acute distress HEENT: Normal NECK: No JVD; No carotid bruits LYMPHATICS: No lymphadenopathy CARDIAC: ***RRR, no murmurs, rubs, gallops RESPIRATORY:  Clear to auscultation without rales, wheezing or rhonchi  ABDOMEN: Soft, non-tender,  non-distended MUSCULOSKELETAL:  No edema; No deformity  SKIN: Warm and dry NEUROLOGIC:  Alert and oriented x 3 PSYCHIATRIC:  Normal affect   ASSESSMENT:    No diagnosis found. PLAN:    In order of problems listed above:  #Abnormal ECG: Inferior q-waves noted in PCP office. Patient denies any anginal symptoms, however, given risk factors, will check TTE and coronary CTA for further evaluation. -Check TTE -Check coronary CTA  #DMII: Followed by Endocrinology. -Continue synjardy -Continue dulaglutide  #HLD: -Continue lipitor '10mg'$  daily -LDL 62      {Are you ordering a CV Procedure (e.g. stress test, cath, DCCV, TEE, etc)?   Press F2        :[id number]}     Medication Adjustments/Labs and Tests Ordered: Current medicines are reviewed at length with the patient today.  Concerns regarding medicines are outlined above.  No orders of the defined types were placed in this encounter.  No orders of the defined types were placed in this encounter.   There are no Patient Instructions on file for this visit.   Signed, Freada Bergeron, MD  10/15/2021 2:56 PM    [place]

## 2021-10-21 ENCOUNTER — Encounter: Payer: Self-pay | Admitting: Cardiology

## 2021-10-21 ENCOUNTER — Ambulatory Visit (INDEPENDENT_AMBULATORY_CARE_PROVIDER_SITE_OTHER): Payer: PPO | Admitting: Cardiology

## 2021-10-21 VITALS — BP 130/80 | HR 65 | Ht 67.0 in | Wt 156.2 lb

## 2021-10-21 DIAGNOSIS — Z01810 Encounter for preprocedural cardiovascular examination: Secondary | ICD-10-CM | POA: Diagnosis not present

## 2021-10-21 DIAGNOSIS — R9431 Abnormal electrocardiogram [ECG] [EKG]: Secondary | ICD-10-CM

## 2021-10-21 DIAGNOSIS — E785 Hyperlipidemia, unspecified: Secondary | ICD-10-CM

## 2021-10-21 DIAGNOSIS — E782 Mixed hyperlipidemia: Secondary | ICD-10-CM | POA: Diagnosis not present

## 2021-10-21 DIAGNOSIS — E1169 Type 2 diabetes mellitus with other specified complication: Secondary | ICD-10-CM | POA: Diagnosis not present

## 2021-10-21 MED ORDER — METOPROLOL TARTRATE 50 MG PO TABS: ORAL_TABLET | ORAL | 0 refills | Status: AC

## 2021-10-21 NOTE — Progress Notes (Signed)
Cardiology Office Note:    Date:  10/21/2021   ID:  Brett Wilkerson, DOB [birth year], MRN [id number]  PCP:  Antony Contras, MD   [facility] Providers Cardiologist:  None {  Referring MD: Antony Contras, MD    History of Present Illness:    Brett Wilkerson is a 66 y.o. male with a hx of DMII, AAA, GERD, and HLD who was referred by Dr. Moreen Fowler for further evaluation of an abnormal ECG.   Patient was seen by Dr. Moreen Fowler for a follow-up visit on 10/10/21 where he was incidentally noted to have q-waves in the inferior leads on ECG. Given risk factors and findings, he is now referred to Cardiology for further evaluation.  Today, the patient states that he is feeling well. At home his blood pressure has been well controlled. He states his diabetes is "fairly" controlled. He was started on diabetes medications 15 years ago.  He walks routinely for exercise with no anginal symptoms. He also enjoys working outside.  In his family, his brother had heart surgery and died at 22 yrs old. His mother died of heart failure.  He denies any palpitations, shortness of breath, or peripheral edema. No lightheadedness, headaches, syncope, orthopnea, or PND.  In 6 weeks he is planning a month-long hiking trip in [place].  Past Medical History:  Diagnosis Date   AAA (abdominal aortic aneurysm) ([place])    Diabetes mellitus without complication ([place])    ED (erectile dysfunction)    EKG, abnormal    GERD (gastroesophageal reflux disease)    History of adenomatous polyp of colon    History of elevated PSA    Hyperlipidemia    Melanoma ([place])     Past Surgical History:  Procedure Laterality Date   LEFT SHOULDER REPAIR     SHOULDER SURGERY      Current Medications: Current Meds  Medication Sig   atorvastatin (LIPITOR) 10 MG tablet Take 10 mg by mouth daily.   Dulaglutide 1.5 MG/0.5ML SOPN Inject into the skin.   Empagliflozin-metFORMIN HCl ER (SYNJARDY XR) 12.09-998 MG TB24 Take by mouth.    famotidine (PEPCID) 20 MG tablet Take 20 mg by mouth 2 (two) times daily.   metoprolol tartrate (LOPRESSOR) 50 MG tablet Take one tablet (50 MG) 2 hours prior to your cardiac CT.   sildenafil (REVATIO) 20 MG tablet Take 20 mg by mouth 3 (three) times daily.     Allergies:   Patient has no known allergies.   Social History   Socioeconomic History   Marital status: Married    Spouse name: Not on file   Number of children: Not on file   Years of education: Not on file   Highest education level: Not on file  Occupational History   Not on file  Tobacco Use   Smoking status: Never   Smokeless tobacco: Never  Substance and Sexual Activity   Alcohol use: Never   Drug use: Never   Sexual activity: Not on file  Other Topics Concern   Not on file  Social History Narrative   Not on file   Social Determinants of Health   Financial Resource Strain: Not on file  Food Insecurity: Not on file  Transportation Needs: Not on file  Physical Activity: Not on file  Stress: Not on file  Social Connections: Not on file     Family History: The patient's family history includes Diabetes in his father; Heart attack in his mother; Hypercholesterolemia in his father; Hypertension in  his father.  ROS:   Review of Systems  Constitutional:  Negative for chills and fever.  HENT:  Negative for congestion and tinnitus.   Eyes:  Negative for discharge.  Respiratory:  Negative for cough and wheezing.   Cardiovascular:  Negative for chest pain, palpitations, orthopnea, claudication, leg swelling and PND.  Gastrointestinal:  Negative for abdominal pain and diarrhea.  Genitourinary:  Negative for urgency.  Musculoskeletal:  Negative for falls.  Neurological:  Negative for seizures and headaches.  Endo/Heme/Allergies:  Negative for polydipsia.  Psychiatric/Behavioral:  Negative for depression. The patient is not nervous/anxious.     EKGs/Labs/Other Studies Reviewed:    The following studies were  reviewed today:  No prior cardiovascular studies available.   EKG:  EKG is personally reviewed.  10/21/2021:  Sinus rhythm. Rate 65 bpm. Q waves inferiorly.   Recent Labs: No results found for requested labs within last 8760 hours.   Recent Lipid Panel No results found for: CHOL, TRIG, HDL, CHOLHDL, VLDL, LDLCALC, LDLDIRECT   Risk Assessment/Calculations:           Physical Exam:    VS:  BP 130/80   Pulse 65   Ht '5\' 7"'$  (1.702 m)   Wt 156 lb 3.2 oz (70.9 kg)   SpO2 97%   BMI 24.46 kg/m     Wt Readings from Last 3 Encounters:  10/21/21 156 lb 3.2 oz (70.9 kg)  08/31/20 153 lb (69.4 kg)     GEN: Well nourished, well developed in no acute distress HEENT: Normal NECK: No JVD; No carotid bruits CARDIAC: RRR, no murmurs, rubs, gallops RESPIRATORY:  Clear to auscultation without rales, wheezing or rhonchi  ABDOMEN: Soft, non-tender, non-distended MUSCULOSKELETAL:  No edema; No deformity  SKIN: Warm and dry NEUROLOGIC:  Alert and oriented x 3 PSYCHIATRIC:  Normal affect   ASSESSMENT:    1. Abnormal electrocardiogram   2. Pre-procedural cardiovascular examination   3. Type 2 diabetes mellitus with hyperlipidemia ([place])   4. Mixed hyperlipidemia    PLAN:    In order of problems listed above:  #Abnormal ECG: Inferior q-waves noted in PCP office. Patient denies any anginal symptoms, however, given risk factors and family history, will check TTE and coronary CTA for further evaluation. -Check TTE -Check coronary CTA  #DMII: Followed by Endocrinology. -Continue synjardy -Continue dulaglutide  #HLD: -Continue lipitor '10mg'$  daily -LDL well controlled at 62         Follow-up:  PRN unless testing is abnormal.  Medication Adjustments/Labs and Tests Ordered: Current medicines are reviewed at length with the patient today.  Concerns regarding medicines are outlined above.   Orders Placed This Encounter  Procedures   CT CORONARY MORPH W/CTA COR W/SCORE W/CA W/CM  &/OR WO/CM   Basic Metabolic Panel (BMET)   EKG 12-Lead   ECHOCARDIOGRAM COMPLETE   Meds ordered this encounter  Medications   metoprolol tartrate (LOPRESSOR) 50 MG tablet    Sig: Take one tablet (50 MG) 2 hours prior to your cardiac CT.    Dispense:  1 tablet    Refill:  0   Patient Instructions  Medication Instructions:   *If you need a refill on your cardiac medications before your next appointment, please call your pharmacy*   Lab Work: BMET PRIOR TO CARDIAC CT  If you have labs (blood work) drawn today and your tests are completely normal, you will receive your results only by: [place] (if you have MyChart) OR A paper copy in the mail If  you have any lab test that is abnormal or we need to change your treatment, we will call you to review the results.   Testing/Procedures:   Your physician has requested that you have an echocardiogram. Echocardiography is a painless test that uses sound waves to create images of your heart. It provides your doctor with information about the size and shape of your heart and how well your heart's chambers and valves are working. This procedure takes approximately one hour. There are no restrictions for this procedure.      Your cardiac CT will be scheduled at one of the below locations:   [facility] [address] [place], [place] [address] [phone]  [place] [address] [place], [place] [address] [phone]  If scheduled at [facility], please arrive at the [facility] and Children's Entrance (Entrance C2) of [facility] 30 minutes prior to test start time. You can use the FREE valet parking offered at entrance C (encouraged to control the heart rate for the test)  Proceed to the [facility] Rad[facility] Department (first floor) to check-in and test prep.  All radiology patients and guests should use entrance C2 at [facility],  accessed from [facility], even though the hospital's physical address listed is [address]    If scheduled at [facility], please arrive 15 mins early for check-in and test prep.  Please follow these instructions carefully (unless otherwise directed):  Hold all erectile dysfunction medications at least 3 days (72 hrs) prior to test.  On the Night Before the Test: Be sure to Drink plenty of water. Do not consume any caffeinated/decaffeinated beverages or chocolate 12 hours prior to your test. Do not take any antihistamines 12 hours prior to your test. If the patient has contrast allergy:  On the Day of the Test: Drink plenty of water until 1 hour prior to the test. Do not eat any food 4 hours prior to the test. You may take your regular medications prior to the test.  Take metoprolol (Lopressor) two hours prior to test. HOLD Furosemide/Hydrochlorothiazide morning of the test.        After the Test: Drink plenty of water. After receiving IV contrast, you may experience a mild flushed feeling. This is normal. On occasion, you may experience a mild rash up to 24 hours after the test. This is not dangerous. If this occurs, you can take Benadryl 25 mg and increase your fluid intake. If you experience trouble breathing, this can be serious. If it is severe call 911 IMMEDIATELY. If it is mild, please call our office. If you take any of these medications: Glipizide/Metformin, Avandament, Glucavance, please do not take 48 hours after completing test unless otherwise instructed.  We will call to schedule your test 2-4 weeks out understanding that some insurance companies will need an authorization prior to the service being performed.   For non-scheduling related questions, please contact the cardiac imaging nurse navigator should you have any questions/concerns: Marchia Bond, Cardiac Imaging Nurse Navigator Gordy Clement, Cardiac Imaging  Nurse Navigator [facility] Direct Office Dial: [phone]   For scheduling needs, including cancellations and rescheduling, please call [place], [phone].    Follow-Up: At [facility], you and your health needs are our priority.  As part of our continuing mission to provide you with exceptional heart care, we have created designated Provider Care Teams.  These Care Teams include your primary Cardiologist (physician) and Advanced Practice Providers (APPs -  Physician Assistants and Nurse Practitioners) who all work together to provide you with the care you need, when you need it.  We recommend signing up for the patient portal called "MyChart".  Sign up information is provided on this After Visit Summary.  MyChart is used to connect with patients for Virtual Visits (Telemedicine).  Patients are able to view lab/test results, encounter notes, upcoming appointments, etc.  Non-urgent messages can be sent to your provider as well.   To learn more about what you can do with MyChart, go to [email or web address].     Important Information About Sugar         I,Mathew Stumpf,acting as a scribe for Freada Bergeron, MD.,have documented all relevant documentation on the behalf of Freada Bergeron, MD,as directed by  Freada Bergeron, MD while in the presence of Freada Bergeron, MD.  I, Freada Bergeron, MD, have reviewed all documentation for this visit. The documentation on 10/21/21 for the exam, diagnosis, procedures, and orders are all accurate and complete.   Signed, Freada Bergeron, MD  10/21/2021 11:22 AM    [facility]

## 2021-10-21 NOTE — Patient Instructions (Addendum)
Medication Instructions:   *If you need a refill on your cardiac medications before your next appointment, please call your pharmacy*   Lab Work: BMET PRIOR TO CARDIAC CT  If you have labs (blood work) drawn today and your tests are completely normal, you will receive your results only by: [place] (if you have MyChart) OR A paper copy in the mail If you have any lab test that is abnormal or we need to change your treatment, we will call you to review the results.   Testing/Procedures:   Your physician has requested that you have an echocardiogram. Echocardiography is a painless test that uses sound waves to create images of your heart. It provides your doctor with information about the size and shape of your heart and how well your heart's chambers and valves are working. This procedure takes approximately one hour. There are no restrictions for this procedure.      Your cardiac CT will be scheduled at one of the below locations:   [facility] [address] [place], [place] [address] [phone]  [place] [address] [place], [place] [address] [phone]  If scheduled at [facility], please arrive at the [facility] and Children's Entrance (Entrance C2) of [facility] 30 minutes prior to test start time. You can use the FREE valet parking offered at entrance C (encouraged to control the heart rate for the test)  Proceed to the [facility] Radiology Department (first floor) to check-in and test prep.  All radiology patients and guests should use entrance C2 at [facility], accessed from [facility], even though the hospital's physical address listed is [address]    If scheduled at [facility], please arrive 15 mins early for check-in and test prep.  Please follow these instructions carefully (unless otherwise  directed):  Hold all erectile dysfunction medications at least 3 days (72 hrs) prior to test.  On the Night Before the Test: Be sure to Drink plenty of water. Do not consume any caffeinated/decaffeinated beverages or chocolate 12 hours prior to your test. Do not take any antihistamines 12 hours prior to your test. If the patient has contrast allergy:  On the Day of the Test: Drink plenty of water until 1 hour prior to the test. Do not eat any food 4 hours prior to the test. You may take your regular medications prior to the test.  Take metoprolol (Lopressor) two hours prior to test. HOLD Furosemide/Hydrochlorothiazide morning of the test.        After the Test: Drink plenty of water. After receiving IV contrast, you may experience a mild flushed feeling. This is normal. On occasion, you may experience a mild rash up to 24 hours after the test. This is not dangerous. If this occurs, you can take Benadryl 25 mg and increase your fluid intake. If you experience trouble breathing, this can be serious. If it is severe call 911 IMMEDIATELY. If it is mild, please call our office. If you take any of these medications: Glipizide/Metformin, Avandament, Glucavance, please do not take 48 hours after completing test unless otherwise instructed.  We will call to schedule your test 2-4 weeks out understanding that some insurance companies will need an authorization prior to the service being performed.   For non-scheduling related questions, please contact the cardiac imaging nurse navigator should you have any questions/concerns: Marchia Bond, Cardiac Imaging Nurse Navigator Kerin Ransom  Jearld Shines, Cardiac Imaging Nurse Navigator  [facility] Direct Office Dial: [phone]   For scheduling needs, including cancellations and rescheduling, please call [place], [phone].    Follow-Up: At [facility], you and your health needs are our priority.  As part of our  continuing mission to provide you with exceptional heart care, we have created designated Provider Care Teams.  These Care Teams include your primary Cardiologist (physician) and Advanced Practice Providers (APPs -  Physician Assistants and Nurse Practitioners) who all work together to provide you with the care you need, when you need it.  We recommend signing up for the patient portal called "MyChart".  Sign up information is provided on this After Visit Summary.  MyChart is used to connect with patients for Virtual Visits (Telemedicine).  Patients are able to view lab/test results, encounter notes, upcoming appointments, etc.  Non-urgent messages can be sent to your provider as well.   To learn more about what you can do with MyChart, go to [email or web address].     Important Information About Sugar

## 2021-10-24 DIAGNOSIS — D225 Melanocytic nevi of trunk: Secondary | ICD-10-CM | POA: Diagnosis not present

## 2021-10-24 DIAGNOSIS — Z08 Encounter for follow-up examination after completed treatment for malignant neoplasm: Secondary | ICD-10-CM | POA: Diagnosis not present

## 2021-10-24 DIAGNOSIS — Z1283 Encounter for screening for malignant neoplasm of skin: Secondary | ICD-10-CM | POA: Diagnosis not present

## 2021-10-24 DIAGNOSIS — Z85820 Personal history of malignant melanoma of skin: Secondary | ICD-10-CM | POA: Diagnosis not present

## 2021-10-24 DIAGNOSIS — D485 Neoplasm of uncertain behavior of skin: Secondary | ICD-10-CM | POA: Diagnosis not present

## 2021-11-07 ENCOUNTER — Other Ambulatory Visit (HOSPITAL_COMMUNITY): Payer: Self-pay | Admitting: *Deleted

## 2021-11-07 ENCOUNTER — Telehealth (HOSPITAL_COMMUNITY): Payer: Self-pay | Admitting: *Deleted

## 2021-11-07 DIAGNOSIS — Z01810 Encounter for preprocedural cardiovascular examination: Secondary | ICD-10-CM

## 2021-11-07 DIAGNOSIS — R9431 Abnormal electrocardiogram [ECG] [EKG]: Secondary | ICD-10-CM

## 2021-11-10 ENCOUNTER — Ambulatory Visit (HOSPITAL_COMMUNITY): Payer: PPO | Attending: Internal Medicine

## 2021-11-10 ENCOUNTER — Other Ambulatory Visit: Payer: PPO

## 2021-11-10 DIAGNOSIS — R9431 Abnormal electrocardiogram [ECG] [EKG]: Secondary | ICD-10-CM

## 2021-11-10 DIAGNOSIS — Z01810 Encounter for preprocedural cardiovascular examination: Secondary | ICD-10-CM

## 2021-11-10 LAB — ECHOCARDIOGRAM COMPLETE
Area-P 1/2: 3.65 cm2
S' Lateral: 2.9 cm

## 2021-11-11 ENCOUNTER — Encounter (HOSPITAL_COMMUNITY): Payer: Self-pay

## 2021-11-11 ENCOUNTER — Ambulatory Visit (HOSPITAL_COMMUNITY)
Admission: RE | Admit: 2021-11-11 | Discharge: 2021-11-11 | Disposition: A | Discharge status: Home / Self Care | Payer: PPO | Source: Ambulatory Visit | Attending: Cardiology | Admitting: Cardiology

## 2021-11-11 DIAGNOSIS — R9431 Abnormal electrocardiogram [ECG] [EKG]: Secondary | ICD-10-CM

## 2021-11-11 LAB — BASIC METABOLIC PANEL
BUN/Creatinine Ratio: 21 (ref 10–24)
BUN: 16 mg/dL (ref 8–27)
CO2: 22 mmol/L (ref 20–29)
Calcium: 9.2 mg/dL (ref 8.6–10.2)
Chloride: 99 mmol/L (ref 96–106)
Creatinine, Ser: 0.76 mg/dL (ref 0.76–1.27)
Glucose: 341 mg/dL — ABNORMAL HIGH (ref 70–99)
Potassium: 4.5 mmol/L (ref 3.5–5.2)
Sodium: 136 mmol/L (ref 134–144)
eGFR: 100 mL/min/{1.73_m2} (ref >59)

## 2021-11-11 LAB — POCT I-STAT CREATININE: Creatinine, Ser: 0.7 mg/dL (ref 0.61–1.24)

## 2021-11-11 MED ORDER — METOPROLOL TARTRATE 5 MG/5ML IV SOLN
5.0000 mg | INTRAVENOUS | Status: DC | PRN
  Administered 2021-11-11: 5 mg via INTRAVENOUS

## 2021-11-11 MED ORDER — IOHEXOL 350 MG/ML SOLN
100.0000 mL | Freq: Once | INTRAVENOUS | Status: AC | PRN
  Administered 2021-11-11: 100 mL via INTRAVENOUS

## 2021-11-11 MED ORDER — NITROGLYCERIN 0.4 MG SL SUBL
0.8000 mg | SUBLINGUAL_TABLET | Freq: Once | SUBLINGUAL | Status: AC
  Administered 2021-11-11: 0.8 mg via SUBLINGUAL

## 2021-11-11 MED ORDER — NITROGLYCERIN 0.4 MG SL SUBL
SUBLINGUAL_TABLET | SUBLINGUAL | Status: AC
  Filled 2021-11-11: qty 2

## 2021-11-11 MED ORDER — METOPROLOL TARTRATE 5 MG/5ML IV SOLN
INTRAVENOUS | Status: AC
  Administered 2021-11-11: 5 mg via INTRAVENOUS
  Filled 2021-11-11: qty 10

## 2022-01-14 DIAGNOSIS — E1165 Type 2 diabetes mellitus with hyperglycemia: Secondary | ICD-10-CM | POA: Diagnosis not present

## 2022-01-14 DIAGNOSIS — E782 Mixed hyperlipidemia: Secondary | ICD-10-CM | POA: Diagnosis not present

## 2022-02-09 DIAGNOSIS — R972 Elevated prostate specific antigen [PSA]: Secondary | ICD-10-CM | POA: Diagnosis not present

## 2022-02-09 DIAGNOSIS — N400 Enlarged prostate without lower urinary tract symptoms: Secondary | ICD-10-CM | POA: Diagnosis not present

## 2022-04-16 DIAGNOSIS — E119 Type 2 diabetes mellitus without complications: Secondary | ICD-10-CM | POA: Diagnosis not present

## 2022-04-16 DIAGNOSIS — E782 Mixed hyperlipidemia: Secondary | ICD-10-CM | POA: Diagnosis not present

## 2022-06-25 DIAGNOSIS — Z08 Encounter for follow-up examination after completed treatment for malignant neoplasm: Secondary | ICD-10-CM | POA: Diagnosis not present

## 2022-06-25 DIAGNOSIS — Z85820 Personal history of malignant melanoma of skin: Secondary | ICD-10-CM | POA: Diagnosis not present

## 2022-06-25 DIAGNOSIS — Z1283 Encounter for screening for malignant neoplasm of skin: Secondary | ICD-10-CM | POA: Diagnosis not present

## 2022-06-25 DIAGNOSIS — D225 Melanocytic nevi of trunk: Secondary | ICD-10-CM | POA: Diagnosis not present

## 2022-07-16 DIAGNOSIS — M25531 Pain in right wrist: Secondary | ICD-10-CM | POA: Diagnosis not present

## 2022-07-23 DIAGNOSIS — R972 Elevated prostate specific antigen [PSA]: Secondary | ICD-10-CM | POA: Diagnosis not present

## 2022-07-30 DIAGNOSIS — N400 Enlarged prostate without lower urinary tract symptoms: Secondary | ICD-10-CM | POA: Diagnosis not present

## 2022-07-30 DIAGNOSIS — R972 Elevated prostate specific antigen [PSA]: Secondary | ICD-10-CM | POA: Diagnosis not present

## 2022-07-31 DIAGNOSIS — H11153 Pinguecula, bilateral: Secondary | ICD-10-CM | POA: Diagnosis not present

## 2022-07-31 DIAGNOSIS — H02834 Dermatochalasis of left upper eyelid: Secondary | ICD-10-CM | POA: Diagnosis not present

## 2022-07-31 DIAGNOSIS — E119 Type 2 diabetes mellitus without complications: Secondary | ICD-10-CM | POA: Diagnosis not present

## 2022-07-31 DIAGNOSIS — H02831 Dermatochalasis of right upper eyelid: Secondary | ICD-10-CM | POA: Diagnosis not present

## 2022-11-03 DIAGNOSIS — E119 Type 2 diabetes mellitus without complications: Secondary | ICD-10-CM | POA: Diagnosis not present

## 2022-11-03 DIAGNOSIS — Z0000 Encounter for general adult medical examination without abnormal findings: Secondary | ICD-10-CM | POA: Diagnosis not present

## 2022-11-03 DIAGNOSIS — K219 Gastro-esophageal reflux disease without esophagitis: Secondary | ICD-10-CM | POA: Diagnosis not present

## 2022-11-03 DIAGNOSIS — R972 Elevated prostate specific antigen [PSA]: Secondary | ICD-10-CM | POA: Diagnosis not present

## 2022-11-03 DIAGNOSIS — E782 Mixed hyperlipidemia: Secondary | ICD-10-CM | POA: Diagnosis not present

## 2022-11-03 DIAGNOSIS — N529 Male erectile dysfunction, unspecified: Secondary | ICD-10-CM | POA: Diagnosis not present

## 2022-11-03 DIAGNOSIS — R030 Elevated blood-pressure reading, without diagnosis of hypertension: Secondary | ICD-10-CM | POA: Diagnosis not present

## 2022-11-03 DIAGNOSIS — Z1331 Encounter for screening for depression: Secondary | ICD-10-CM | POA: Diagnosis not present

## 2022-11-03 DIAGNOSIS — Z1211 Encounter for screening for malignant neoplasm of colon: Secondary | ICD-10-CM | POA: Diagnosis not present

## 2022-11-03 DIAGNOSIS — E1169 Type 2 diabetes mellitus with other specified complication: Secondary | ICD-10-CM | POA: Diagnosis not present

## 2022-11-03 DIAGNOSIS — Z85820 Personal history of malignant melanoma of skin: Secondary | ICD-10-CM | POA: Diagnosis not present

## 2022-11-16 DIAGNOSIS — I10 Essential (primary) hypertension: Secondary | ICD-10-CM | POA: Diagnosis not present

## 2022-11-27 DIAGNOSIS — Z08 Encounter for follow-up examination after completed treatment for malignant neoplasm: Secondary | ICD-10-CM | POA: Diagnosis not present

## 2022-11-27 DIAGNOSIS — Z1283 Encounter for screening for malignant neoplasm of skin: Secondary | ICD-10-CM | POA: Diagnosis not present

## 2022-11-27 DIAGNOSIS — Z85820 Personal history of malignant melanoma of skin: Secondary | ICD-10-CM | POA: Diagnosis not present

## 2022-12-14 DIAGNOSIS — I10 Essential (primary) hypertension: Secondary | ICD-10-CM | POA: Diagnosis not present

## 2023-01-20 DIAGNOSIS — R972 Elevated prostate specific antigen [PSA]: Secondary | ICD-10-CM | POA: Diagnosis not present

## 2023-01-25 DIAGNOSIS — R972 Elevated prostate specific antigen [PSA]: Secondary | ICD-10-CM | POA: Diagnosis not present

## 2023-01-25 DIAGNOSIS — N400 Enlarged prostate without lower urinary tract symptoms: Secondary | ICD-10-CM | POA: Diagnosis not present

## 2023-01-26 ENCOUNTER — Other Ambulatory Visit (HOSPITAL_COMMUNITY): Payer: Self-pay | Admitting: Urology

## 2023-01-26 DIAGNOSIS — R972 Elevated prostate specific antigen [PSA]: Secondary | ICD-10-CM

## 2023-02-04 ENCOUNTER — Ambulatory Visit (HOSPITAL_COMMUNITY)
Admission: RE | Admit: 2023-02-04 | Discharge: 2023-02-04 | Disposition: A | Discharge status: Home / Self Care | Payer: PPO | Source: Ambulatory Visit | Attending: Urology | Admitting: Urology

## 2023-02-04 DIAGNOSIS — K5730 Diverticulosis of large intestine without perforation or abscess without bleeding: Secondary | ICD-10-CM | POA: Diagnosis not present

## 2023-02-04 DIAGNOSIS — N4289 Other specified disorders of prostate: Secondary | ICD-10-CM | POA: Diagnosis not present

## 2023-02-04 DIAGNOSIS — R972 Elevated prostate specific antigen [PSA]: Secondary | ICD-10-CM | POA: Diagnosis not present

## 2023-02-04 DIAGNOSIS — N400 Enlarged prostate without lower urinary tract symptoms: Secondary | ICD-10-CM | POA: Diagnosis not present

## 2023-02-04 MED ORDER — GADOBUTROL 1 MMOL/ML IV SOLN
7.0000 mL | Freq: Once | INTRAVENOUS | Status: AC | PRN
  Administered 2023-02-04: 7 mL via INTRAVENOUS

## 2023-03-15 DIAGNOSIS — R972 Elevated prostate specific antigen [PSA]: Secondary | ICD-10-CM | POA: Diagnosis not present

## 2023-03-15 DIAGNOSIS — N400 Enlarged prostate without lower urinary tract symptoms: Secondary | ICD-10-CM | POA: Diagnosis not present

## 2023-03-25 DIAGNOSIS — R972 Elevated prostate specific antigen [PSA]: Secondary | ICD-10-CM | POA: Diagnosis not present

## 2023-03-25 DIAGNOSIS — N400 Enlarged prostate without lower urinary tract symptoms: Secondary | ICD-10-CM | POA: Diagnosis not present

## 2023-11-03 DIAGNOSIS — E119 Type 2 diabetes mellitus without complications: Secondary | ICD-10-CM | POA: Diagnosis not present

## 2023-11-26 DIAGNOSIS — Z85820 Personal history of malignant melanoma of skin: Secondary | ICD-10-CM | POA: Diagnosis not present

## 2023-11-26 DIAGNOSIS — Z08 Encounter for follow-up examination after completed treatment for malignant neoplasm: Secondary | ICD-10-CM | POA: Diagnosis not present

## 2023-11-26 DIAGNOSIS — Z1283 Encounter for screening for malignant neoplasm of skin: Secondary | ICD-10-CM | POA: Diagnosis not present

## 2023-11-26 DIAGNOSIS — L821 Other seborrheic keratosis: Secondary | ICD-10-CM | POA: Diagnosis not present

## 2023-11-26 DIAGNOSIS — D225 Melanocytic nevi of trunk: Secondary | ICD-10-CM | POA: Diagnosis not present

## 2023-12-20 DIAGNOSIS — I10 Essential (primary) hypertension: Secondary | ICD-10-CM | POA: Diagnosis not present

## 2023-12-20 DIAGNOSIS — Z85820 Personal history of malignant melanoma of skin: Secondary | ICD-10-CM | POA: Diagnosis not present

## 2023-12-20 DIAGNOSIS — K219 Gastro-esophageal reflux disease without esophagitis: Secondary | ICD-10-CM | POA: Diagnosis not present

## 2023-12-20 DIAGNOSIS — E782 Mixed hyperlipidemia: Secondary | ICD-10-CM | POA: Diagnosis not present

## 2023-12-20 DIAGNOSIS — Z1211 Encounter for screening for malignant neoplasm of colon: Secondary | ICD-10-CM | POA: Diagnosis not present

## 2023-12-20 DIAGNOSIS — N529 Male erectile dysfunction, unspecified: Secondary | ICD-10-CM | POA: Diagnosis not present

## 2023-12-20 DIAGNOSIS — R5383 Other fatigue: Secondary | ICD-10-CM | POA: Diagnosis not present

## 2023-12-20 DIAGNOSIS — Z1331 Encounter for screening for depression: Secondary | ICD-10-CM | POA: Diagnosis not present

## 2023-12-20 DIAGNOSIS — R972 Elevated prostate specific antigen [PSA]: Secondary | ICD-10-CM | POA: Diagnosis not present

## 2023-12-20 DIAGNOSIS — Z0000 Encounter for general adult medical examination without abnormal findings: Secondary | ICD-10-CM | POA: Diagnosis not present

## 2023-12-20 DIAGNOSIS — E1169 Type 2 diabetes mellitus with other specified complication: Secondary | ICD-10-CM | POA: Diagnosis not present

## 2023-12-27 DIAGNOSIS — E291 Testicular hypofunction: Secondary | ICD-10-CM | POA: Diagnosis not present

## 2024-03-13 DIAGNOSIS — R972 Elevated prostate specific antigen [PSA]: Secondary | ICD-10-CM | POA: Diagnosis not present

## 2024-03-23 DIAGNOSIS — R972 Elevated prostate specific antigen [PSA]: Secondary | ICD-10-CM | POA: Diagnosis not present
